# Patient Record
Sex: Female | Born: 1995 | Race: Black or African American | Hispanic: No | Marital: Single | Smoking: Never smoker
Health system: Southern US, Community
[De-identification: ages and names within clinical notes are randomized; demographics above are authoritative.]

## PROBLEM LIST (undated history)

## (undated) DIAGNOSIS — A749 Chlamydial infection, unspecified: Secondary | ICD-10-CM

## (undated) DIAGNOSIS — Z3041 Encounter for surveillance of contraceptive pills: Principal | ICD-10-CM

## (undated) DIAGNOSIS — D649 Anemia, unspecified: Secondary | ICD-10-CM

## (undated) DIAGNOSIS — J45909 Unspecified asthma, uncomplicated: Secondary | ICD-10-CM

---

## 2018-08-12 ENCOUNTER — Encounter (HOSPITAL_COMMUNITY): Payer: Self-pay | Admitting: Emergency Medicine

## 2018-08-12 ENCOUNTER — Ambulatory Visit (HOSPITAL_COMMUNITY)
Admission: EM | Admit: 2018-08-12 | Discharge: 2018-08-12 | Disposition: A | Discharge status: Home / Self Care | Payer: BLUE CROSS/BLUE SHIELD | Attending: Family Medicine | Admitting: Family Medicine

## 2018-08-12 DIAGNOSIS — Z91018 Allergy to other foods: Secondary | ICD-10-CM | POA: Diagnosis not present

## 2018-08-12 DIAGNOSIS — R05 Cough: Secondary | ICD-10-CM | POA: Insufficient documentation

## 2018-08-12 DIAGNOSIS — J358 Other chronic diseases of tonsils and adenoids: Secondary | ICD-10-CM | POA: Diagnosis not present

## 2018-08-12 LAB — POCT RAPID STREP A: Streptococcus, Group A Screen (Direct): NEGATIVE

## 2018-08-12 NOTE — ED Provider Notes (Signed)
[facility]    CSN: [id number] Arrival date & time: 08/12/18  1057     History   Chief Complaint No chief complaint on file.   HPI Joanne Dodson is a 22 y.o. female.   Joanne Dodson presents with complaints of white spots to her tonsils which have been present and irritating since approximately 10/5. States has had similar in the past. No fevers. No rash. No gi/gu complaints. States she has some congestion and cough. Has not taken any medications for her symptoms. No known ill contacts. No pain to throat. States feels slightly swollen any maybe more difficult to swallow. Has noticed her mouth is more dry. She otherwise feels very well. No fatigue, no ear pain. Without contributing medical history.      ROS per HPI.      History reviewed. No pertinent past medical history.  There are no active problems to display for this patient.   History reviewed. No pertinent surgical history.  OB History   None      Home Medications    Prior to Admission medications   Not on File    Family History Family History  Problem Relation Age of Onset  . Healthy Other     Social History Social History   Tobacco Use  . Smoking status: Never Smoker  Substance Use Topics  . Alcohol use: Never    Frequency: Never  . Drug use: Never     Allergies   Corn-containing products; Soy allergy; and Wheat bran   Review of Systems Review of Systems   Physical Exam Triage Vital Signs ED Triage Vitals  Enc Vitals Group     BP 08/12/18 1119 126/86     Pulse Rate 08/12/18 1119 89     Resp 08/12/18 1119 16     Temp 08/12/18 1119 98.5 F (36.9 C)     Temp src --      SpO2 08/12/18 1119 96 %     Weight --      Height --      Head Circumference --      Peak Flow --      Pain Score 08/12/18 1120 0     Pain Loc --      Pain Edu? --      Excl. in GC? --    No data found.  Updated Vital Signs BP 126/86   Pulse 89   Temp 98.5 F (36.9 C)   Resp 16   LMP  07/19/2018   SpO2 96%   Visual Acuity Right Eye Distance:   Left Eye Distance:   Bilateral Distance:    Right Eye Near:   Left Eye Near:    Bilateral Near:     Physical Exam  Constitutional: She is oriented to person, place, and time. She appears well-developed and well-nourished. No distress.  HENT:  Head: Normocephalic and atraumatic.  Right Ear: Tympanic membrane, external ear and ear canal normal.  Left Ear: Tympanic membrane, external ear and ear canal normal.  Nose: Nose normal.  Mouth/Throat: Uvula is midline, oropharynx is clear and moist and mucous membranes are normal. Tonsils are 2+ on the right. Tonsils are 2+ on the left. No tonsillar exudate.  Bilateral small tonsil stones noted   Eyes: Pupils are equal, round, and reactive to light. Conjunctivae and EOM are normal.  Cardiovascular: Normal rate, regular rhythm and normal heart sounds.  Pulmonary/Chest: Effort normal and breath sounds normal.  Neurological: She is alert and oriented to  person, place, and time.  Skin: Skin is warm and dry.     UC Treatments / Results  Labs (all labs ordered are listed, but only abnormal results are displayed) Labs Reviewed  CULTURE, GROUP A STREP [facility])  POCT RAPID STREP A    EKG None  Radiology No results found.  Procedures Procedures (including critical care time)  Medications Ordered in UC Medications - No data to display  Initial Impression / Assessment and Plan / UC Course  I have reviewed the triage vital signs and the nursing notes.  Pertinent labs & imaging results that were available during my care of the patient were reviewed by me and considered in my medical decision making (see chart for details).     Afebrile. Non toxic. Negative rapid strep. Exam consistent with tonsil stones. Supportive cares recommended. Push fluids. Follow up with PCP and/or ENT as needed. Patient verbalized understanding and agreeable to plan.   Final Clinical Impressions(s) /  UC Diagnoses   Final diagnoses:  Tonsil stone     Discharge Instructions     Negative rapid strep here today.  Appears more likely to be tonsil stones which you are feeling and seeing.  Gargling with warm salt water can be helpful, or even using a water pick can be helpful.  If symptoms worsen or do not improve in the next week to return to be seen or to follow up with your PCP.   May follow up with Ear Nose and Throat as needed for further evaluation.    ED Prescriptions    None     Controlled Substance Prescriptions  Controlled Substance Registry consulted? Not Applicable   Georgetta Haber, NP 08/12/18 1152

## 2018-08-12 NOTE — Discharge Instructions (Signed)
Negative rapid strep here today.  Appears more likely to be tonsil stones which you are feeling and seeing.  Gargling with warm salt water can be helpful, or even using a water pick can be helpful.  If symptoms worsen or do not improve in the next week to return to be seen or to follow up with your PCP.   May follow up with Ear Nose and Throat as needed for further evaluation.

## 2018-08-12 NOTE — ED Triage Notes (Signed)
Pt states her tonsils are enlarged with white spots on them denies pain.

## 2018-08-14 LAB — CULTURE, GROUP A STREP (THRC)

## 2019-06-18 ENCOUNTER — Emergency Department (HOSPITAL_COMMUNITY)
Admission: EM | Admit: 2019-06-18 | Discharge: 2019-06-18 | Disposition: A | Discharge status: Home / Self Care | Payer: BC Managed Care – PPO | Attending: Emergency Medicine | Admitting: Emergency Medicine

## 2019-06-18 ENCOUNTER — Ambulatory Visit (HOSPITAL_COMMUNITY)
Admission: EM | Admit: 2019-06-18 | Discharge: 2019-06-18 | Disposition: A | Discharge status: Home / Self Care | Payer: BC Managed Care – PPO | Source: Home / Self Care

## 2019-06-18 ENCOUNTER — Encounter (HOSPITAL_COMMUNITY): Payer: Self-pay

## 2019-06-18 ENCOUNTER — Emergency Department (HOSPITAL_COMMUNITY): Payer: BC Managed Care – PPO

## 2019-06-18 ENCOUNTER — Other Ambulatory Visit: Payer: Self-pay

## 2019-06-18 DIAGNOSIS — R55 Syncope and collapse: Secondary | ICD-10-CM | POA: Diagnosis not present

## 2019-06-18 DIAGNOSIS — J45909 Unspecified asthma, uncomplicated: Secondary | ICD-10-CM | POA: Diagnosis not present

## 2019-06-18 HISTORY — DX: Unspecified asthma, uncomplicated: J45.909

## 2019-06-18 HISTORY — DX: Anemia, unspecified: D64.9

## 2019-06-18 LAB — CBC
HCT: 39.8 % (ref 36.0–46.0)
Hemoglobin: 12.7 g/dL (ref 12.0–15.0)
MCH: 26.3 pg (ref 26.0–34.0)
MCHC: 31.9 g/dL (ref 30.0–36.0)
MCV: 82.4 fL (ref 80.0–100.0)
Platelets: 373 10*3/uL (ref 150–400)
RBC: 4.83 MIL/uL (ref 3.87–5.11)
RDW: 13.6 % (ref 11.5–15.5)
WBC: 9 10*3/uL (ref 4.0–10.5)
nRBC: 0 % (ref 0.0–0.2)

## 2019-06-18 LAB — I-STAT BETA HCG BLOOD, ED (MC, WL, AP ONLY): I-stat hCG, quantitative: <5 m[IU]/mL (ref <5)

## 2019-06-18 LAB — URINALYSIS, ROUTINE W REFLEX MICROSCOPIC
Bilirubin Urine: NEGATIVE
Glucose, UA: NEGATIVE mg/dL
Ketones, ur: NEGATIVE mg/dL
Nitrite: NEGATIVE
Protein, ur: NEGATIVE mg/dL
Specific Gravity, Urine: 1.002 — ABNORMAL LOW (ref 1.005–1.030)
pH: 7 (ref 5.0–8.0)

## 2019-06-18 LAB — BASIC METABOLIC PANEL
Anion gap: 14 (ref 5–15)
BUN: 5 mg/dL — ABNORMAL LOW (ref 6–20)
CO2: 20 mmol/L — ABNORMAL LOW (ref 22–32)
Calcium: 9 mg/dL (ref 8.9–10.3)
Chloride: 101 mmol/L (ref 98–111)
Creatinine, Ser: 0.95 mg/dL (ref 0.44–1.00)
GFR calc Af Amer: >60 mL/min (ref >60)
GFR calc non Af Amer: >60 mL/min (ref >60)
Glucose, Bld: 88 mg/dL (ref 70–99)
Potassium: 3.5 mmol/L (ref 3.5–5.1)
Sodium: 135 mmol/L (ref 135–145)

## 2019-06-18 MED ORDER — SODIUM CHLORIDE 0.9% FLUSH: 3.0000 mL | Freq: Once | INTRAVENOUS | Status: DC

## 2019-06-18 MED ORDER — SODIUM CHLORIDE 0.9 % IV BOLUS
1000.0000 mL | Freq: Once | INTRAVENOUS | Status: AC
  Administered 2019-06-18: 1000 mL via INTRAVENOUS

## 2019-06-18 NOTE — ED Provider Notes (Signed)
[facility] Provider Note   CSN: [id number] Arrival date & time: 06/18/19  1624     History   Chief Complaint Chief Complaint  Patient presents with   Loss of Consciousness    HPI Joanne Dodson is a 23 y.o. female with no significant past medical history presenting to the ED after syncopal episode at work earlier today.  Patient reports she was feeling lightheaded while working at [employer] and turned around to tell someone she was not feeling well when she collapsed and hit her head on the way to the ground.  She denies having any chest pain, shortness of breath, or palpitations prior to the syncopal episode.  She reports that yesterday she felt more fatigued than normal but was feeling better today prior to this episode.  She reports she ate an adequate breakfast and drank 3 bottles of water earlier today.  She denies any history of prior syncopal episodes.  Her only complaint is a mild headache around her left eye where she struck her head.  She denies any recent illness, fever, chills, nausea, vomiting, diarrhea, or any other complaints.     The history is provided by the patient.    Past Medical History:  Diagnosis Date   Anemia    Asthma     There are no active problems to display for this patient.   History reviewed. No pertinent surgical history.   OB History   No obstetric history on file.      Home Medications    Prior to Admission medications   Not on File    Family History Family History  Problem Relation Age of Onset   Healthy Other     Social History Social History   Tobacco Use   Smoking status: Never Smoker  Substance Use Topics   Alcohol use: Never    Frequency: Never   Drug use: Never     Allergies   Corn-containing products, Soy allergy, and Wheat bran   Review of Systems Review of Systems  Constitutional: Negative for chills and fever.  HENT: Negative for ear pain and  sore throat.   Eyes: Negative for pain and visual disturbance.  Respiratory: Negative for cough and shortness of breath.   Cardiovascular: Negative for chest pain and palpitations.  Gastrointestinal: Negative for abdominal pain and vomiting.  Genitourinary: Negative for dysuria and hematuria.  Musculoskeletal: Negative for arthralgias and back pain.  Skin: Negative for color change and rash.  Neurological: Positive for syncope and light-headedness. Negative for seizures.  All other systems reviewed and are negative.    Physical Exam Updated Vital Signs BP 114/73    Pulse 90    Temp 99.6 F (37.6 C) (Oral)    Resp 16    Ht 5\' 2"  (1.575 m)    Wt 66.2 kg    LMP 06/14/2019    SpO2 99%    BMI 26.70 kg/m   Physical Exam Vitals signs and nursing note reviewed.  Constitutional:      General: She is not in acute distress.    Appearance: Normal appearance. She is normal weight. She is not ill-appearing, toxic-appearing or diaphoretic.  HENT:     Head: Normocephalic and atraumatic.     Nose: Nose normal. No congestion or rhinorrhea.     Mouth/Throat:     Mouth: Mucous membranes are moist.     Pharynx: Oropharynx is clear. No oropharyngeal exudate or posterior oropharyngeal erythema.  Eyes:  Extraocular Movements: Extraocular movements intact.     Conjunctiva/sclera: Conjunctivae normal.     Pupils: Pupils are equal, round, and reactive to light.  Neck:     Musculoskeletal: Normal range of motion and neck supple. No neck rigidity or muscular tenderness.  Cardiovascular:     Rate and Rhythm: Normal rate and regular rhythm.     Pulses: Normal pulses.     Heart sounds: Normal heart sounds. No murmur. No friction rub. No gallop.   Pulmonary:     Effort: Pulmonary effort is normal. No respiratory distress.     Breath sounds: Normal breath sounds. No stridor. No wheezing, rhonchi or rales.  Chest:     Chest wall: No tenderness.  Abdominal:     General: Abdomen is flat. There is no  distension.     Palpations: Abdomen is soft.     Tenderness: There is no abdominal tenderness. There is no guarding or rebound.  Musculoskeletal: Normal range of motion.        General: No swelling, tenderness, deformity or signs of injury.  Skin:    General: Skin is warm and dry.  Neurological:     General: No focal deficit present.     Mental Status: She is alert and oriented to person, place, and time. Mental status is at baseline.     Cranial Nerves: No cranial nerve deficit.     Sensory: No sensory deficit.     Motor: No weakness.      ED Treatments / Results  Labs (all labs ordered are listed, but only abnormal results are displayed) Labs Reviewed  BASIC METABOLIC PANEL - Abnormal; Notable for the following components:      Result Value   CO2 20 (*)    BUN 5 (*)    All other components within normal limits  URINALYSIS, ROUTINE W REFLEX MICROSCOPIC - Abnormal; Notable for the following components:   Color, Urine STRAW (*)    Specific Gravity, Urine 1.002 (*)    Hgb urine dipstick MODERATE (*)    Leukocytes,Ua LARGE (*)    Bacteria, UA RARE (*)    All other components within normal limits  CBC  I-STAT BETA HCG BLOOD, ED ([facility], [facility], [facility] ONLY)    EKG EKG Interpretation  Date/Time:  Saturday June 18 2019 16:39:06 EDT Ventricular Rate:  119 PR Interval:  118 QRS Duration: 82 QT Interval:  320 QTC Calculation: 450 R Axis:   84 Text Interpretation:  Sinus tachycardia Biatrial enlargement Abnormal ECG No old tracing to compare Confirmed by Eber HongMiller, Brian ([id number]) on 06/18/2019 5:25:49 PM   Radiology Ct Head Wo Contrast  Result Date: 06/18/2019 CLINICAL DATA:  Syncope, fall, headache EXAM: CT HEAD WITHOUT CONTRAST TECHNIQUE: Contiguous axial images were obtained from the base of the skull through the vertex without intravenous contrast. COMPARISON:  None. FINDINGS: Brain: No evidence of acute infarction, hemorrhage, hydrocephalus, extra-axial collection or mass lesion/mass  effect. Vascular: No hyperdense vessel or unexpected calcification. Skull: Normal. Negative for fracture or focal lesion. Sinuses/Orbits: The visualized paranasal sinuses are essentially clear. The mastoid air cells are unopacified. Other: None. IMPRESSION: Normal head CT. Electronically Signed   By: Charline BillsSriyesh  Krishnan M.D.   On: 06/18/2019 17:05    Procedures Procedures (including critical care time)  Medications Ordered in ED Medications  sodium chloride 0.9 % bolus 1,000 mL (0 mLs Intravenous Stopped 06/18/19 1951)     Initial Impression / Assessment and Plan / ED Course  I have reviewed the triage  vital signs and the nursing notes.  Pertinent labs & imaging results that were available during my care of the patient were reviewed by me and considered in my medical decision making (see chart for details).        Joanne Dodson is a 23 y.o. female with no significant past medical history presenting to the ED after syncopal episode at work earlier today.  On exam, patient appears well and has normal vital signs.  EKG was reviewed and showed sinus tachycardia with a rate of 119.  There is no evidence of delta wave or arrhythmia.  Patient's heart rate improved at time of examination.  Labs including BMP, CBC, urinalysis, and hCG are all unremarkable.  There is no evidence of anemia or dehydration.  Orthostatic vitals were obtained which showed no change in blood pressure but some tachycardia on standing.  Patient was given a 1 L bolus of normal saline.  Patient syncope is most likely vasovagal in nature.  She was advised to follow-up with her primary care physician and to return to the ED for any recurrence of symptoms.  She was discharged home in stable condition.   Final Clinical Impressions(s) / ED Diagnoses   Final diagnoses:  Syncope and collapse    ED Discharge Orders    None       Garry HeaterEames, Jenay Morici, MD 06/19/19 Reola [id number]    Eber HongMiller, Brian, MD 06/19/19 1459

## 2019-06-18 NOTE — ED Provider Notes (Signed)
I saw and evaluated the patient, reviewed the resident's note and I agree with the findings and plan.  Pertinent History: The patient is an otherwise healthy 23 year old female who takes no significant over-the-counter medications, prescription medications and does not smoke drink or do any drugs.  She denies any caffeine intake.  She was at work today when she had a syncopal episode.  She does remember feeling lightheaded just prior to this but had no palpitations chest pain or shortness of breath.  Yesterday she was excessively fatigued, she feels less so today and has not had any fevers nausea vomiting or diarrhea.  She did have a good amount of food for breakfast and had 3 water bottles to drink throughout the day.  At this time she feels back to her normal self other than feeling tired.  Pertinent Exam findings: On exam the patient has a soft nontender abdomen with clear heart and lung sounds, she does get tachycardic with standing but I visualized the patient ambulate throughout the department and she appears very well with normal gait, normal memory, normal neurologic exam.  I was personally present and directly supervised the following procedures:  I have looked at the EKG, no acute findings, the patient does have some slight orthostasis, IV fluids given, encouraged her to remain at home resting for the next 24 hours.  Patient agreeable, stable for discharge, labs reviewed and unremarkable.  Results for orders placed or performed during the hospital encounter of 29/52/84  Basic metabolic panel  Result Value Ref Range   Sodium 135 135 - 145 mmol/L   Potassium 3.5 3.5 - 5.1 mmol/L   Chloride 101 98 - 111 mmol/L   CO2 20 (L) 22 - 32 mmol/L   Glucose, Bld 88 70 - 99 mg/dL   BUN 5 (L) 6 - 20 mg/dL   Creatinine, Ser 0.95 0.44 - 1.00 mg/dL   Calcium 9.0 8.9 - 10.3 mg/dL   GFR calc non Af Amer >60 >60 mL/min   GFR calc Af Amer >60 >60 mL/min   Anion gap 14 5 - 15  CBC  Result Value Ref Range    WBC 9.0 4.0 - 10.5 K/uL   RBC 4.83 3.87 - 5.11 MIL/uL   Hemoglobin 12.7 12.0 - 15.0 g/dL   HCT 39.8 36.0 - 46.0 %   MCV 82.4 80.0 - 100.0 fL   MCH 26.3 26.0 - 34.0 pg   MCHC 31.9 30.0 - 36.0 g/dL   RDW 13.6 11.5 - 15.5 %   Platelets 373 150 - 400 K/uL   nRBC 0.0 0.0 - 0.2 %  Urinalysis, Routine w reflex microscopic  Result Value Ref Range   Color, Urine STRAW (A) YELLOW   APPearance CLEAR CLEAR   Specific Gravity, Urine 1.002 (L) 1.005 - 1.030   pH 7.0 5.0 - 8.0   Glucose, UA NEGATIVE NEGATIVE mg/dL   Hgb urine dipstick MODERATE (A) NEGATIVE   Bilirubin Urine NEGATIVE NEGATIVE   Ketones, ur NEGATIVE NEGATIVE mg/dL   Protein, ur NEGATIVE NEGATIVE mg/dL   Nitrite NEGATIVE NEGATIVE   Leukocytes,Ua LARGE (A) NEGATIVE   WBC, UA 6-10 0 - 5 WBC/hpf   Bacteria, UA RARE (A) NONE SEEN   Squamous Epithelial / LPF 0-5 0 - 5  I-Stat beta hCG blood, ED  Result Value Ref Range   I-stat hCG, quantitative <5.0 <5 mIU/mL   Comment 3           Ct Head Wo Contrast  Result Date: 06/18/2019  CLINICAL DATA:  Syncope, fall, headache EXAM: CT HEAD WITHOUT CONTRAST TECHNIQUE: Contiguous axial images were obtained from the base of the skull through the vertex without intravenous contrast. COMPARISON:  None. FINDINGS: Brain: No evidence of acute infarction, hemorrhage, hydrocephalus, extra-axial collection or mass lesion/mass effect. Vascular: No hyperdense vessel or unexpected calcification. Skull: Normal. Negative for fracture or focal lesion. Sinuses/Orbits: The visualized paranasal sinuses are essentially clear. The mastoid air cells are unopacified. Other: None. IMPRESSION: Normal head CT. Electronically Signed   By: Charline BillsSriyesh  Krishnan M.D.   On: 06/18/2019 17:05    EKG Interpretation  Date/Time:  Saturday June 18 2019 16:39:06 EDT Ventricular Rate:  119 PR Interval:  118 QRS Duration: 82 QT Interval:  320 QTC Calculation: 450 R Axis:   84 Text Interpretation:  Sinus tachycardia Biatrial  enlargement Abnormal ECG No old tracing to compare Confirmed by Eber HongMiller, Taleen Prosser ([id number]) on 06/18/2019 5:25:49 PM        I personally interpreted the EKG as well as the resident and agree with the interpretation on the resident's chart.  Final diagnoses:  Syncope and collapse      Eber HongMiller, Jahaira Earnhart, MD 06/19/19 1459

## 2019-06-18 NOTE — ED Triage Notes (Signed)
Pt reports syncopal episode while at work today, hit her head during the fall, no other injuries. Pt c.o feeling weak and having a headache at this time. Pt a.o, ambulatory

## 2019-06-18 NOTE — ED Notes (Signed)
Patient Alert and oriented to baseline. Stable and ambulatory to baseline. Patient verbalized understanding of the discharge instructions.  Patient belongings were taken by the patient.   

## 2019-10-10 ENCOUNTER — Other Ambulatory Visit: Payer: Self-pay

## 2019-10-10 DIAGNOSIS — Z20822 Contact with and (suspected) exposure to covid-19: Secondary | ICD-10-CM

## 2019-10-12 LAB — NOVEL CORONAVIRUS, NAA: SARS-CoV-2, NAA: NOT DETECTED

## 2020-04-02 IMAGING — CT CT HEAD WITHOUT CONTRAST
4 series · 16 of 47 positions shown, 18 images · non-contrast
Comparison: None.

CLINICAL DATA: Syncope, fall, headache

EXAM:
CT HEAD WITHOUT CONTRAST
TECHNIQUE: Contiguous axial images were obtained from the base of the skull
through the vertex without intravenous contrast.

[Series 3: head without · axial · non-contrast · 0.46mm/px · z∈[-152,-32]mm · 7 of 34 slices shown, 9 images]
[im 5/34  brain]
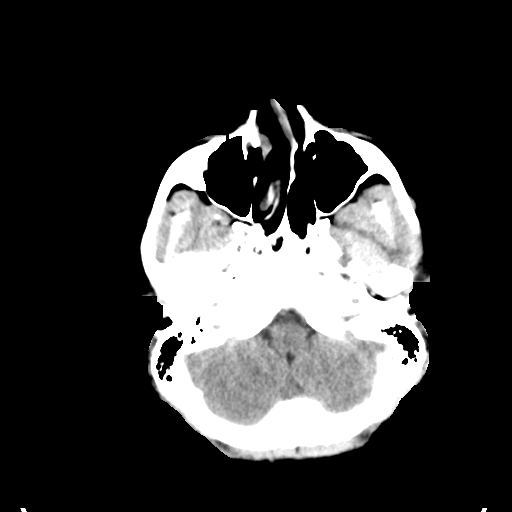
[im 5/34  bone]
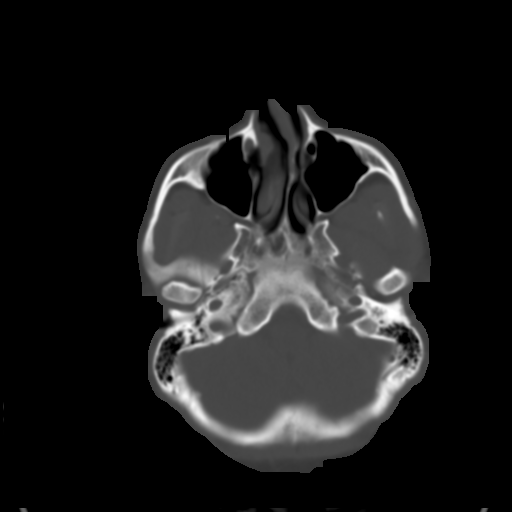
[im 9/34  brain]
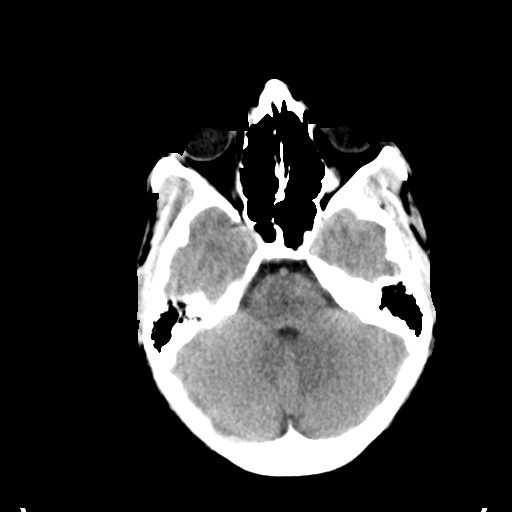
[im 13/34  brain]
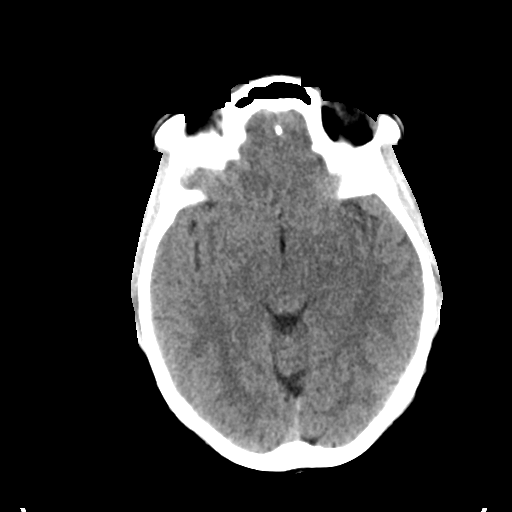
[im 17/34  brain]
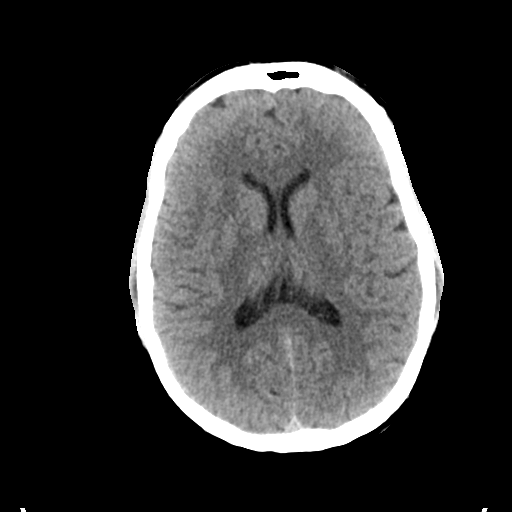
[im 21/34  brain]
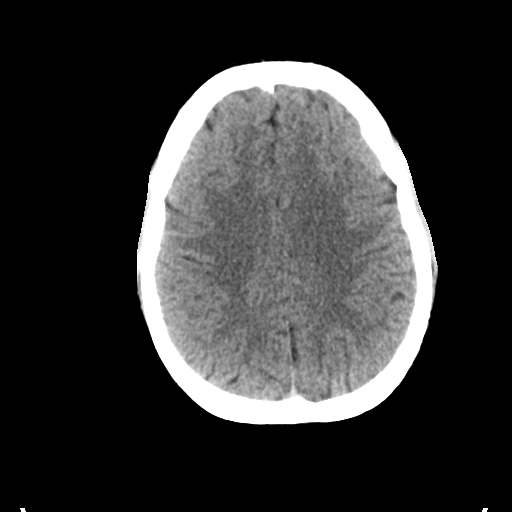
[im 21/34  bone]
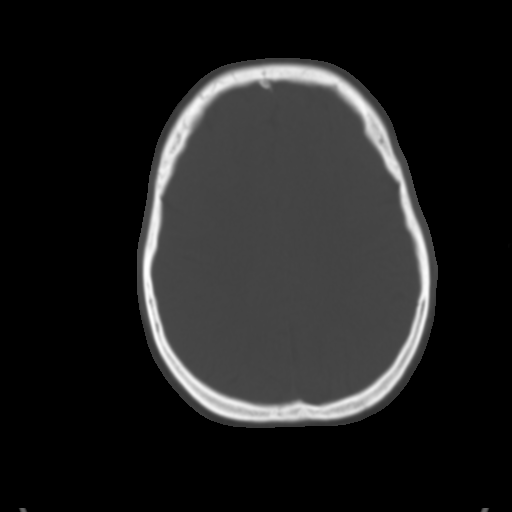
[im 25/34  brain]
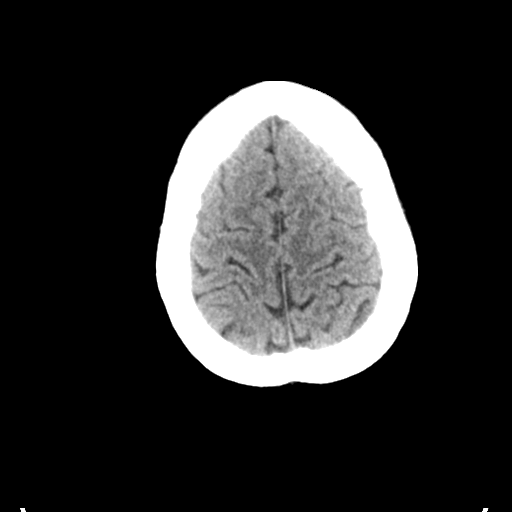
[im 29/34  brain]
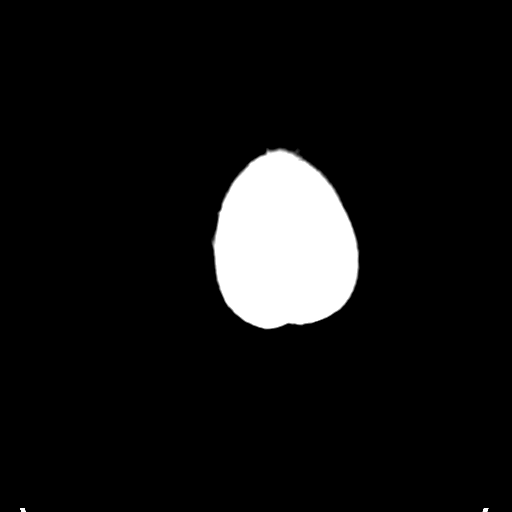

[Series 4: head bone · axial · 0.46mm/px · z∈[-156,-124]mm · 3 of 84 slices shown]
[im 9/84  bone]
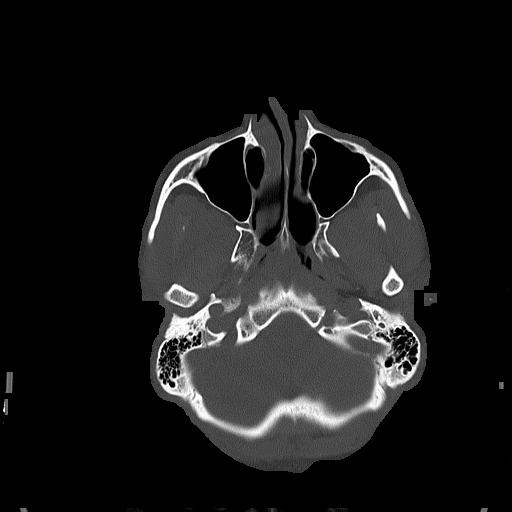
[im 17/84  bone]
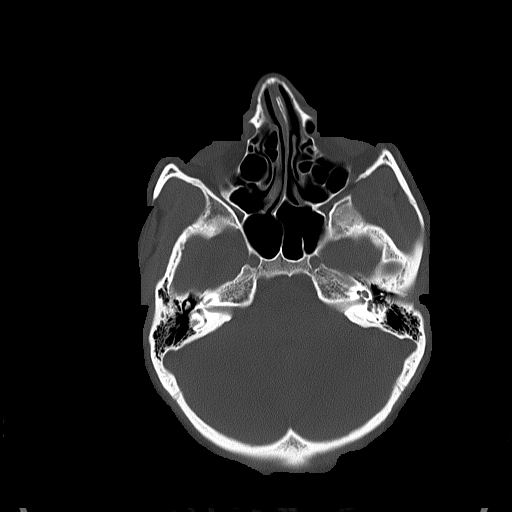
[im 25/84  bone]
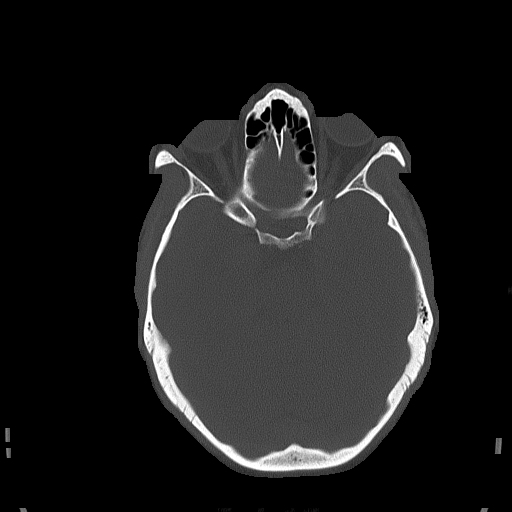

[Series 5: head without cor · coronal · non-contrast · 0.31mm/px · 3 of 71 slices shown]
[im 27/71  brain]
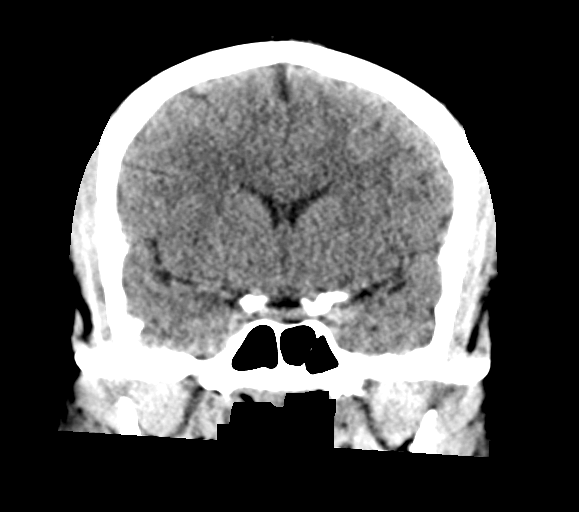
[im 33/71  brain]
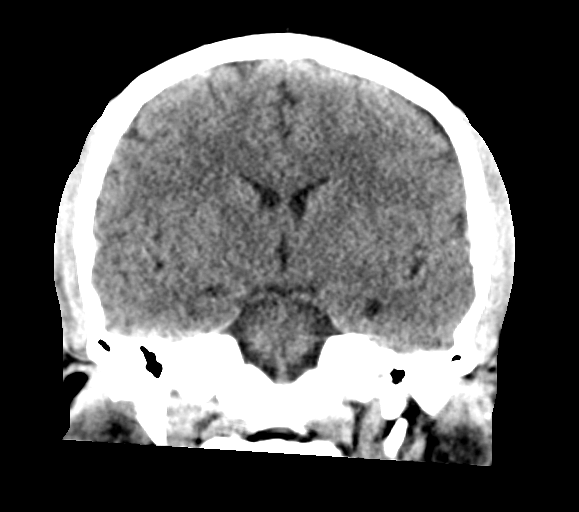
[im 38/71  brain]
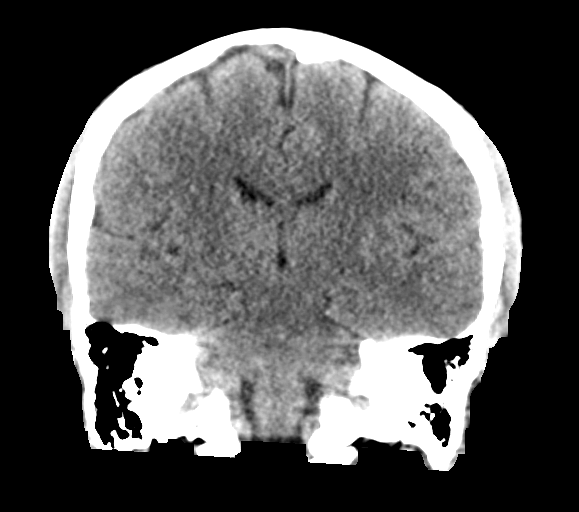

[Series 6: head without sag · sagittal · non-contrast · 0.33mm/px · 3 of 62 slices shown]
[im 21/62  brain]
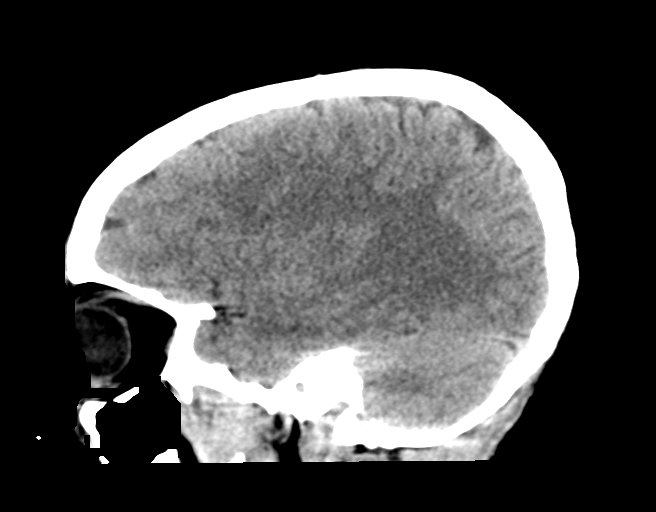
[im 31/62  brain]
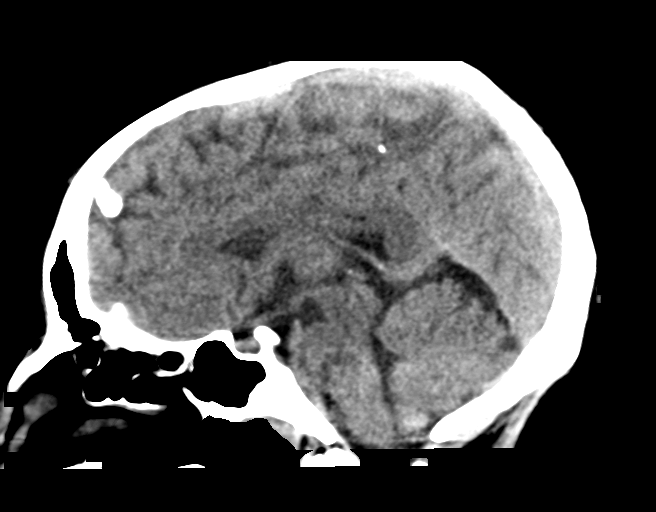
[im 41/62  brain]
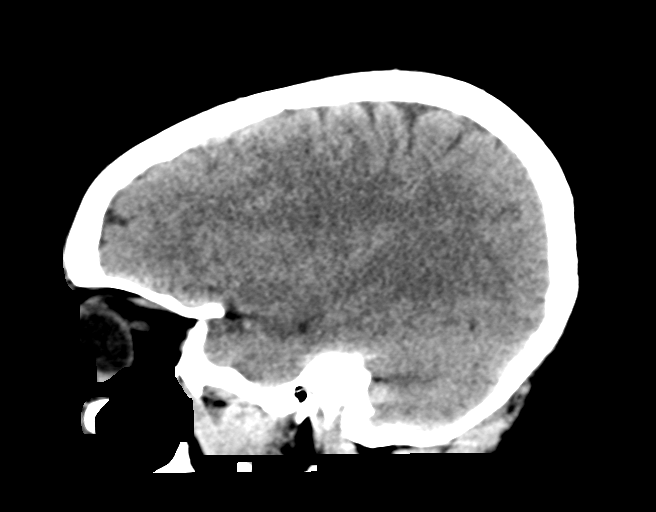

[16 of 47 positions shown; findings below may reference images not displayed]

FINDINGS: Brain: No evidence of acute infarction, hemorrhage, hydrocephalus,
extra-axial collection or mass lesion/mass effect.

Vascular: No hyperdense vessel or unexpected calcification.

Skull: Normal. Negative for fracture or focal lesion.

Sinuses/Orbits: The visualized paranasal sinuses are essentially
clear. The mastoid air cells are unopacified.

Other: None.
IMPRESSION: Normal head CT.

## 2021-10-01 ENCOUNTER — Ambulatory Visit
Admit: 2021-10-01 | Discharge: 2021-10-01 | Payer: PRIVATE HEALTH INSURANCE | Attending: Family Medicine | Primary: Family Medicine

## 2021-10-01 DIAGNOSIS — Z0000 Encounter for general adult medical examination without abnormal findings: Secondary | ICD-10-CM

## 2021-10-01 MED ORDER — NORETHIN ACE-ETH ESTRAD-FE 1-20 MG-MCG(24) PO CHEW
1-20-24 MG-MCG(24) | PACK | Freq: Every day | ORAL | 3 refills | Status: AC
Start: 2021-10-01 — End: 2021-12-24

## 2021-10-01 MED ORDER — TETANUS-DIPHTH-ACELL PERTUSSIS 5-2.5-18.5 LF-MCG/0.5 IM SUSP
5-2.5-18.5-0.5 LF-MCG/0.5 | Freq: Once | INTRAMUSCULAR | 0 refills | Status: DC
Start: 2021-10-01 — End: 2021-10-01

## 2021-10-01 MED ORDER — TETANUS-DIPHTH-ACELL PERTUSSIS 5-2.5-18.5 LF-MCG/0.5 IM SUSP
Freq: Once | INTRAMUSCULAR | 0 refills | Status: AC
Start: 2021-10-01 — End: 2021-10-01

## 2021-10-01 NOTE — Progress Notes (Signed)
SUBJECTIVE:   25 y.o. female for annual routine Pap and checkup.  25 year old G0 P0 female comes in to establish. LMP Nov 26 x 5 days. Menses monthly lasting 5 days. No problems with birth control. Last pap smear 02/21/21 normal. Always had normal pap smears. Has had 2 normal pap smears.  Mgm gm  Pgm lung cancer.  BMI 31. Says her diet is not good although exercises regularly with pilates. Will try to lose 5-10 lbs by next visit.  Last blood work 10/22. Normal cholesterol and sugars.   Current Outpatient Medications   Medication Sig Dispense Refill    Tetanus-Diphth-Acell Pertussis (BOOSTRIX) 5-2.5-18.5 LF-MCG/0.5 injection Inject 0.5 mLs into the muscle once for 1 dose 1 each 0    Norethin Ace-Eth Estrad-FE (FINZALA) 1-20 MG-MCG(24) CHEW Take 1 tablet by mouth daily 3 packet 3     No current facility-administered medications for this visit.     Allergies: Patient has no known allergies.   Patient's last menstrual period was 09/19/2021.    ROS:  Feeling well. No dyspnea or chest pain on exertion.  No abdominal pain, change in bowel habits, black or bloody stools.  No urinary tract symptoms. GYN ROS: normal menses, no abnormal bleeding, pelvic pain or discharge, no breast pain or new or enlarging lumps on self exam. No neurological complaints.    OBJECTIVE:   The patient appears well, alert, oriented x 3, in no distress.  BP 121/88    Pulse 85    Temp 98.8 ??F (37.1 ??C) (Temporal)    Resp 14    Ht 5' 2.5" (1.588 m)    Wt 173 lb (78.5 kg)    LMP 09/19/2021    SpO2 99%    BMI 31.14 kg/m??   ENT normal.  Neck supple. No adenopathy or thyromegaly. PERLA. Lungs are clear, good air entry, no wheezes, rhonchi or rales. S1 and S2 normal, no murmurs, regular rate and rhythm. Abdomen soft without tenderness, guarding, mass or organomegaly. Extremities show no edema, normal peripheral pulses. Neurological is normal, no focal findings.    BREAST EXAM: performed by specialist    PELVIC EXAM: exam declined by the  patient    ASSESSMENT:   well woman    PLAN:   pap smear will do next year  return annually or prn      ICD-10-CM    1. Annual physical exam  Z00.00       2. Need for Tdap vaccination  Z23 Tetanus-Diphth-Acell Pertussis (BOOSTRIX) 5-2.5-18.5 LF-MCG/0.5 injection     DISCONTINUED: Tetanus-Diphth-Acell Pertussis (BOOSTRIX) 5-2.5-18.5 LF-MCG/0.5 injection      3. BMI 31.0-31.9,adult  Z68.31       4. Screening for diabetes mellitus  Z13.1 Comprehensive Metabolic Panel     Hemoglobin F6C     Urinalysis with Reflex to Culture      5. Screening for thyroid disorder  Z13.29 TSH with Reflex      6. Screening, ischemic heart disease  Z13.6 CBC with Auto Differential     Comprehensive Metabolic Panel     Lipid Panel      7. Need for hepatitis C screening test  Z11.59 Hepatitis C Antibody      8. Encounter for surveillance of contraceptive pills  Z30.41 Norethin Ace-Eth Estrad-FE (FINZALA) 1-20 MG-MCG(24) CHEW      1.  Annual physical.  Patient refused flu shot.  Tdap vaccination sent to pharmacy.  2.  BMI of 31.  Advised to improve diet.  Continue exercise.  She will try to lose 10 pounds by next visit.  3.  Birth control.  Doing well.  Continue oral contraceptive pills.  4.  Previous doctor will send immunization records.  Follow-up 1 year labs prior

## 2022-02-03 ENCOUNTER — Ambulatory Visit
Admit: 2022-02-03 | Discharge: 2022-02-03 | Payer: PRIVATE HEALTH INSURANCE | Attending: Women's Health | Primary: Family Medicine

## 2022-02-03 DIAGNOSIS — Z124 Encounter for screening for malignant neoplasm of cervix: Secondary | ICD-10-CM

## 2022-02-03 MED ORDER — NORETHIN ACE-ETH ESTRAD-FE 1-20 MG-MCG(24) PO CHEW
1-20 MG-MCG(24) | PACK | Freq: Every day | ORAL | 3 refills | Status: AC
Start: 2022-02-03 — End: 2022-05-07

## 2022-02-03 NOTE — Progress Notes (Signed)
Pt comes in today for AE.     LAST PAP:  Pt reports 2021 negative     LAST MAMMO:  Never    LMP:  Patient's last menstrual period was 01/15/2022 (exact date).    BIRTH CONTROL:  OCP (estrogen/progesterone)    TOBACCO USE:  No    FAMILY HISTORY OF:   Breast Cancer:  No   Ovarian Cancer:  No   Uterine Cancer:  No   Colon Cancer:  No    Vitals:    02/03/22 1352   BP: 124/80   Site: Left Upper Arm   Position: Sitting   Weight: 175 lb (79.4 kg)   Height: 5' 2.5" (1.588 m)        [place], [place]  02/03/22  2:00 PM

## 2022-02-03 NOTE — Assessment & Plan Note (Signed)
Pap today + GC  Declines serum std labs  mammo n/a  Refill sent for OCPs  Patient denies h/o DVT, stroke, MI, migraines, liver disease or HTN  RTO 1 year

## 2022-02-03 NOTE — Progress Notes (Signed)
Brianna Williamson is a 26 y.o. G0P0000 who is here today for annual exam. No concerns        Patient's last menstrual period was 01/15/2022 (exact date).    Menses:Q month, lasting 4-5 days    Menstrual Complaints: none    Birth Control:OCPs    Last Pap:2021    Hx abnormal pap or WLN:LGXQJJ    Hx of receiving HPV vaccination:received per pt    Last Mammo: n/a    Fam Hx of Breast, ovarian or uterine cancer:denies    Sexually Active:yes    Number of partners in the last year:1          ROS:    Breast: Denies pain, lump or nipple discharge    GYN: Denies pelvic pain, discharge, itching, odor or dysuria.     Constitutional: Negative for chills and fever.     HENT: Negative for severe headaches or vision changes    Respiratory: Negative for cough and shortness of breath.      Cardiovascular: Negative for chest pain and palpitations.     Gastrointestinal: Negative for nausea and vomiting. Negative for diarrhea and constipation    Genitourinary: Negative for dysuria and hematuria.     Musculoskeletal: Negative for back pain    Skin: Negative for rash and wound.     Psych: No depression or anxiety          History reviewed. No pertinent past medical history.    Past Surgical History:   Procedure Laterality Date    WISDOM TOOTH EXTRACTION         Family History   Problem Relation Age of Onset    Hypertension Maternal Grandmother     High Cholesterol Maternal Grandmother     No Known Problems Father     No Known Problems Mother     No Known Problems Brother     Breast Cancer Neg Hx     Ovarian Cancer Neg Hx     Uterine Cancer Neg Hx     Colon Cancer Neg Hx        Social History     Socioeconomic History    Marital status: Unknown     Spouse name: Not on file    Number of children: Not on file    Years of education: Not on file    Highest education level: Not on file   Occupational History    Not on file   Tobacco Use    Smoking status: Never    Smokeless tobacco: Never   Vaping Use    Vaping Use: Never used   Substance and  Sexual Activity    Alcohol use: Yes     Comment: socially    Drug use: Never    Sexual activity: Yes     Partners: Male   Other Topics Concern    Not on file   Social History Narrative    Abuse: Feels safe at home, no history of physical abuse, no history of sexual abuse      Social Determinants of Psychologist, prison and probation services Strain: Low Risk     Difficulty of Paying Living Expenses: Not hard at all   Food Insecurity: No Food Insecurity    Worried About Programme researcher, broadcasting/film/video in the Last Year: Never true    Ran Out of Food in the Last Year: Never true   Transportation Needs: Not on file   Physical Activity: Not on file  Stress: Not on file   Social Connections: Not on file   Intimate Partner Violence: Not on file   Housing Stability: Not on file           Objective:    Vitals:    02/03/22 1352   BP: 124/80   Site: Left Upper Arm   Position: Sitting   Weight: 175 lb (79.4 kg)   Height: 5' 2.5" (1.588 m)           Constitutional:  well-developed, well-nourished, and in no distress.     Mental: Alert and awake. Oriented to person/place/time. Able to follow commands    Eyes: EOM nl, Sclera nl, Ocular Discharge not visualized    HENT: Normocephalic and atraumatic. Mouth/Throat: Mucous membranes are moist. External Ears: nl. No cervical code adneopathy    Neck: Normal range of motion. Neck supple. No thyromegaly present.     Cardiovascular: Normal rate and regular rhythm.      Pulmonary: Effort normal; No visualized signs of difficulty breathing or respiratory distress; breath sounds normal.    Breast: The breasts exhibit no masses, no skin changes and no tenderness. No axillary node adenopathy. Bilateral piercings    Abdominal: Soft. There is no tenderness. There is no rebound.     Pelvic Exam:       External: normal female genitalia without lesions or masses       Vagina: normal without lesions or masses, no discharge noted      Cervix: normal without lesions or masses       Adnexa: normal bimanual exam without masses  or fullness       Uterus: uterus is normal size, shape, consistency and nontender    Musculoskeletal: Normal range of motion. Normal gait with no signs of ataxia     Neurological: No Facial Asymmetry (Cranial nerve 7 motor function); No gaze palsy; normal coordination, muscle strength and tone     Skin: Skin is warm and dry. No significant exanthematous lesions or discoloration noted on facial skin      Psych: Normal affect. No hallucinations            Assessment/Plan            Patient Active Problem List    Diagnosis Date Noted    BMI 31.0-31.9,adult 10/01/2021     Priority: Medium    Women's annual routine gynecological examination 02/03/2022     Assessment & Plan Note:     Pap today + GC  Declines serum std labs  mammo n/a  Refill sent for OCPs  Patient denies h/o DVT, stroke, MI, migraines, liver disease or HTN  RTO 1 year             Problem List Items Addressed This Visit          Other    Women's annual routine gynecological examination     Pap today + GC  Declines serum std labs  mammo n/a  Refill sent for OCPs  Patient denies h/o DVT, stroke, MI, migraines, liver disease or HTN  RTO 1 year              Other Visit Diagnoses       Pap smear for cervical cancer screening    -  Primary    Relevant Orders    PAP IG, CT-NG, rfx Aptima HPV ASCUS ([id number])    Screen for STD (sexually transmitted disease)        Relevant Orders  PAP IG, CT-NG, rfx Aptima HPV ASCUS ([id number])            Orders Placed This Encounter   Procedures    PAP IG, CT-NG, rfx Aptima HPV ASCUS ([id number])       Outpatient Encounter Medications as of 02/03/2022   Medication Sig Dispense Refill    Norethin Ace-Eth Estrad-FE (FINZALA) 1-20 MG-MCG(24) CHEW Take 1 tablet by mouth daily 3 packet 3     No facility-administered encounter medications on file as of 02/03/2022.

## 2022-02-03 NOTE — Progress Notes (Signed)
I have reviewed the patient's visit today including history, exam and assessment by Memory Argue, WHNP-BC.  I agree with treatment/plan as above.    Phylliss Bob, MD  2:54 PM  02/03/22

## 2022-02-10 ENCOUNTER — Encounter

## 2022-02-10 LAB — PAP IG, CT-NG, RFX APTIMA HPV ASCUS (199320)
Chlamydia trachomatis, NAA: POSITIVE — AB
Neisseria Gonorrhoeae, NAA: NEGATIVE

## 2022-02-10 NOTE — Telephone Encounter (Signed)
Patient tested positive for Chlamydia. This is a sexually transmitted infection. This can be treated with the following, if not allergic    Azithromycin 1gram PO Once, No Refills    Patient needs to notify partner so they can follow up with their PCP or [facility] for testing and/or treatment. If patient is pregnant, we will retest during her pregnancy. If not pregnant, please schedule patient for retest in 3 months.        2. Patient positive for yeast infection. If having symptoms, can have one of the following for treatment, if not allergic    Diflucan 150 mg PO Take 1 tab and repeat in 3 days if needed, #2, No Refills (NOT TO BE PRESCRIBED IF PREGNANT)    OR    Terazol 7 Apply 1 applicator nightly for 7 nights, #7, No Refills

## 2022-02-10 NOTE — Telephone Encounter (Signed)
Pt lvm asking about test results. Called pt back, message below reviewed with pt, pt voiced understanding.     RX pend

## 2022-02-11 MED ORDER — FLUCONAZOLE 150 MG PO TABS
150 MG | ORAL_TABLET | ORAL | 0 refills | Status: AC
Start: 2022-02-11 — End: 2022-05-07

## 2022-02-11 MED ORDER — AZITHROMYCIN 500 MG PO TABS
500 MG | ORAL_TABLET | Freq: Once | ORAL | 0 refills | Status: AC
Start: 2022-02-11 — End: 2022-02-11

## 2022-02-11 NOTE — Telephone Encounter (Signed)
RX sent.

## 2022-02-13 NOTE — Telephone Encounter (Signed)
Called pt to schedule 3 month TOC, no answer, LVM.    MyChart message sent.

## 2022-05-07 ENCOUNTER — Ambulatory Visit
Admit: 2022-05-07 | Discharge: 2022-05-07 | Payer: PRIVATE HEALTH INSURANCE | Attending: Women's Health | Primary: Family Medicine

## 2022-05-07 DIAGNOSIS — Z113 Encounter for screening for infections with a predominantly sexual mode of transmission: Secondary | ICD-10-CM

## 2022-05-07 NOTE — Progress Notes (Signed)
Brianna Williamson is a 26 y.o. G0P0000 who is here today for TOC. Tested positive for chlamydia April 2023      Patient's last menstrual period was 04/10/2022 (exact date).        Past Medical History:   Diagnosis Date    Chlamydia        Past Surgical History:   Procedure Laterality Date    WISDOM TOOTH EXTRACTION         Family History   Problem Relation Age of Onset    Hypertension Maternal Grandmother     High Cholesterol Maternal Grandmother     No Known Problems Father     No Known Problems Mother     No Known Problems Brother     Breast Cancer Neg Hx     Ovarian Cancer Neg Hx     Uterine Cancer Neg Hx     Colon Cancer Neg Hx        Social History     Socioeconomic History    Marital status: Unknown     Spouse name: Not on file    Number of children: Not on file    Years of education: Not on file    Highest education level: Not on file   Occupational History    Not on file   Tobacco Use    Smoking status: Never    Smokeless tobacco: Never   Vaping Use    Vaping Use: Never used   Substance and Sexual Activity    Alcohol use: Yes     Comment: socially    Drug use: Never    Sexual activity: Yes     Partners: Male     Birth control/protection: None     Comment: differnt partner   Other Topics Concern    Not on file   Social History Narrative    Abuse: Feels safe at home, no history of physical abuse, no history of sexual abuse      Social Determinants of Psychologist, prison and probation services Strain: Low Risk     Difficulty of Paying Living Expenses: Not hard at all   Food Insecurity: No Food Insecurity    Worried About Programme researcher, broadcasting/film/video in the Last Year: Never true    Ran Out of Food in the Last Year: Never true   Transportation Needs: Unknown    Lack of Transportation (Medical): Not on file    Lack of Transportation (Non-Medical): No   Physical Activity: Not on file   Stress: Not on file   Social Connections: Not on file   Intimate Partner Violence: Not on file   Housing Stability: Unknown    Unable to Pay for  Housing in the Last Year: Not on file    Number of Places Lived in the Last Year: Not on file    Unstable Housing in the Last Year: No           Objective:    Vitals:    05/07/22 0857   BP: 106/62   Site: Right Upper Arm   Position: Sitting   Weight: 178 lb (80.7 kg)   Height: 5' 2.5" (1.588 m)         Constitutional:  well-developed, well-nourished, and in no distress.     Mental: Alert and awake. Oriented to person/place/time. Able to follow commands    Eyes: EOM nl, Sclera nl, Ocular Discharge not visualized    HENT: Normocephalic and atraumatic. Mouth/Throat: Mucous membranes  are moist    External Ears: nl    Neck: Normal range of motion. No masses visualized       Pulmonary: Effort normal. No visualized signs of difficulty breathing or respiratory distress    Pelvic Exam:       External: normal female genitalia without lesions or masses   Nuswab collected    Musculoskeletal: Normal range of motion. Normal gait with no signs of ataxia     Neurological: No Facial Asymmetry (Cranial nerve 7 motor function) No gaze palsy    Skin: No significant exanthematous lesions or discoloration noted on facial skin      Psych: Normal affect. No hallucinations              Assessment/Plan            Patient Active Problem List    Diagnosis Date Noted    BMI 31.0-31.9,adult 10/01/2021     Priority: Medium    Screen for STD (sexually transmitted disease) 05/07/2022     Assessment & Plan Note:     TOC today  GC/CT/TV collected  Declines serum std labs today  Condom use strongly encouraged, we discuss risks of PID/infertility with repeat infections           Problem List Items Addressed This Visit          Other    Screen for STD (sexually transmitted disease) - Primary     TOC today  GC/CT/TV collected  Declines serum std labs today  Condom use strongly encouraged, we discuss risks of PID/infertility with repeat infections           Relevant Orders    Chlamydia, Gonorrhea, Trichomoniasis       Orders Placed This Encounter    Procedures    Chlamydia, Gonorrhea, Trichomoniasis       Outpatient Encounter Medications as of 05/07/2022   Medication Sig Dispense Refill    Norethin Ace-Eth Estrad-FE (FINZALA) 1-20 MG-MCG(24) CHEW Take 1 tablet by mouth daily 3 packet 3    [DISCONTINUED] fluconazole (DIFLUCAN) 150 MG tablet Take 1 tab and repeat in 3 days if needed. 2 tablet 0     No facility-administered encounter medications on file as of 05/07/2022.               Vernell Barrier, APRN - CNP 05/07/22 9:12 AM

## 2022-05-07 NOTE — Progress Notes (Signed)
I have reviewed the patient's visit today including history, exam and assessment by Memory Argue, WHNP-BC.  I agree with treatment/plan as above.    Phylliss Bob, MD  9:15 AM  05/07/22

## 2022-05-07 NOTE — Assessment & Plan Note (Signed)
TOC today  GC/CT/TV collected  Declines serum std labs today  Condom use strongly encouraged, we discuss risks of PID/infertility with repeat infections

## 2022-05-07 NOTE — Progress Notes (Signed)
Patient here for 3 mo TOC for chlamydia.     Patient take medication-yes   S/S today-denies     LAST PAP:  02/03/2022, neg.,     LAST MAMMO:  never     LMP:  Patient's last menstrual period was 04/10/2022 (exact date).    BIRTH CONTROL:  OCP (estrogen/progesterone)    TOBACCO USE:  No    FAMILY HISTORY OF:   Breast Cancer:  No   Ovarian Cancer:  No   Uterine Cancer:  No   Colon Cancer:  No    Vitals:    05/07/22 0857   BP: 106/62   Site: Right Upper Arm   Position: Sitting   Weight: 178 lb (80.7 kg)   Height: 5' 2.5" (1.588 m)        Nicoletta Ba, RN  05/07/22  9:06 AM

## 2022-05-10 LAB — CHLAMYDIA, GONORRHEA, TRICHOMONIASIS
Chlamydia trachomatis, NAA: NEGATIVE
Neisseria Gonorrhoeae, NAA: NEGATIVE
Trichomonas Vaginalis by NAA: NEGATIVE

## 2022-09-11 NOTE — Telephone Encounter (Signed)
Pt lvm asking if she needs blood work before an appointment due to a specific issue she is having. Called pt back, pt states she was traveling and missed OCPs for 2 weeks, but then started taking them again. Pt stated in September she had a period, spotted one day in October, has not started in November but her period tracker says she may start today. Asked pt if she has taken a pregnancy tests. She stated she has taken 4 and they are all negative so she wants to know if she will need to come in for HCG draw. Informed pt that the irregular bleeding could be due to her skipping pills but I will need to consult with Dr. Bonney Leitz. Pt voiced understanding.

## 2022-09-11 NOTE — Telephone Encounter (Signed)
With negative pregnancy tests, this is more likely than not due to missed OCPs.  Her cycle may be irreg for a while until her hormones balance out again

## 2022-09-11 NOTE — Telephone Encounter (Signed)
Called pt, message below reviewed with pt, informed pt if this is ongoing for months then to call and schedule an appointment to be seen. Pt voiced understanding.

## 2022-11-24 NOTE — Telephone Encounter (Signed)
Called patient back, patient stated concern of late menses with current BC she is taking. Patient states that her LMP was 10/11/2022 and she has taken 4 pregnancy tests that have all come back negative, last UPT was 11/24/2022.     Stated to patient that amenorrhea is not an unusual result of taking birth control as long as her UPTs are negative which patient states they have been.     Patient also states she did stop her BC a week ago due to being scared that she may be pregnant, stated to patient that as long her UPT is negative that she can resume BC. Recommended patient repeat UPT in 1-2 weeks and call office with results no matter what they are.     Patient verbalized understanding.

## 2022-12-10 ENCOUNTER — Encounter
Admit: 2022-12-10 | Discharge: 2022-12-10 | Payer: PRIVATE HEALTH INSURANCE | Attending: Women's Health | Primary: Family Medicine

## 2022-12-10 DIAGNOSIS — N926 Irregular menstruation, unspecified: Secondary | ICD-10-CM

## 2022-12-10 LAB — AMB POC URINE PREGNANCY TEST, VISUAL COLOR COMPARISON: HCG, Pregnancy, Urine, POC: NEGATIVE

## 2022-12-10 NOTE — Progress Notes (Signed)
Brianna Williamson is a 27 y.o. G0P0000 who is here to discuss OCPs. Pt currently taking combined OCPs. Pharmacy did change the generic she receives. She did not get a withdraw bleed in in January. She did have symptoms of menses but did not actually have a bleed. Denies any other concerns (HA, nipple discharge, CP, SOB, N/V/D/C, UTI symptoms, or vaginal discharge itching or odor.    Hx chlamydia. Declines std screening today.   Has AE scheduled for April      Patient's last menstrual period was 10/11/2022 (exact date).            ROS:    Breast: Denies pain, lump or nipple discharge    GYN: Denies pelvic pain, discharge, itching, odor or dysuria.     Constitutional: Negative for chills and fever.     Gastrointestinal: Negative for nausea and vomiting. Negative for diarrhea and constipation    Genitourinary: Negative for dysuria and hematuria.           Past Medical History:   Diagnosis Date    Chlamydia        Past Surgical History:   Procedure Laterality Date    WISDOM TOOTH EXTRACTION         Family History   Problem Relation Age of Onset    Hypertension Maternal Grandmother     High Cholesterol Maternal Grandmother     No Known Problems Father     No Known Problems Mother     No Known Problems Brother     Breast Cancer Neg Hx     Ovarian Cancer Neg Hx     Uterine Cancer Neg Hx     Colon Cancer Neg Hx        Social History     Socioeconomic History    Marital status: Unknown     Spouse name: Not on file    Number of children: Not on file    Years of education: Not on file    Highest education level: Not on file   Occupational History    Not on file   Tobacco Use    Smoking status: Never    Smokeless tobacco: Never   Vaping Use    Vaping Use: Never used   Substance and Sexual Activity    Alcohol use: Yes     Comment: socially    Drug use: Never    Sexual activity: Yes     Partners: Male     Birth control/protection: OCP   Other Topics Concern    Not on file   Social History Narrative    Abuse: Feels safe at  home, no history of physical abuse, no history of sexual abuse      Social Determinants of Health     Financial Resource Strain: Low Risk  (05/07/2022)    Overall Financial Resource Strain (CARDIA)     Difficulty of Paying Living Expenses: Not hard at all   Food Insecurity: Not on file (05/07/2022)   Transportation Needs: Unknown (05/07/2022)    PRAPARE - Armed forces logistics/support/administrative officer (Medical): Not on file     Lack of Transportation (Non-Medical): No   Physical Activity: Not on file   Stress: Not on file   Social Connections: Not on file   Intimate Partner Violence: Not on file   Housing Stability: Unknown (05/07/2022)    Housing Stability Vital Sign     Unable to Pay for Housing in the Last Year:  Not on file     Number of Places Lived in the Last Year: Not on file     Unstable Housing in the Last Year: No           Objective:    Vitals:    12/10/22 1349   BP: 120/70   Site: Right Upper Arm   Position: Sitting   Weight: 82.6 kg (182 lb)   Height: 1.588 m (5' 2.5")         Constitutional:  well-developed, well-nourished, and in no distress.     Mental: Alert and awake. Oriented to person/place/time. Able to follow commands    Eyes: EOM nl, Sclera nl, Ocular Discharge not visualized    HENT: Normocephalic and atraumatic. Mouth/Throat: Mucous membranes are moist    External Ears: nl    Neck: Normal range of motion. No masses visualized       Pulmonary: Effort normal. No visualized signs of difficulty breathing or respiratory distress    Musculoskeletal: Normal range of motion. Normal gait with no signs of ataxia     Neurological: No Facial Asymmetry (Cranial nerve 7 motor function) No gaze palsy    Skin: No significant exanthematous lesions or discoloration noted on facial skin      Psych: Normal affect. No hallucinations              Assessment/Plan            Patient Active Problem List    Diagnosis Date Noted    BMI 31.0-31.9,adult 10/01/2021     Priority: Medium    Amenorrhea 12/10/2022     Assessment &  Plan Note:     UPT negative  Likely nl variant with OCPs  We discuss add'l testing with tsh, prl, cbc, cmp. Pt declines as absence of any other symptoms  Continue OCPs, advised to take uPT monthly if no menses and ok to continue if negative  F/u as scheduled for AE in 2 months         Problem List Items Addressed This Visit          Other    Amenorrhea     UPT negative  Likely nl variant with OCPs  We discuss add'l testing with tsh, prl, cbc, cmp. Pt declines as absence of any other symptoms  Continue OCPs, advised to take uPT monthly if no menses and ok to continue if negative  F/u as scheduled for AE in 2 months          Other Visit Diagnoses       Irregular menses    -  Primary    Relevant Orders    AMB POC URINE PREGNANCY TEST, VISUAL COLOR COMPARISON            Orders Placed This Encounter   Procedures    AMB POC URINE PREGNANCY TEST, VISUAL COLOR COMPARISON       Outpatient Encounter Medications as of 12/10/2022   Medication Sig Dispense Refill    Norethin Ace-Eth Estrad-FE (FINZALA) 1-20 MG-MCG(24) CHEW Take 1 tablet by mouth daily 3 packet 3     No facility-administered encounter medications on file as of 12/10/2022.               Marcelino Freestone, APRN - CNP 12/10/22 2:19 PM

## 2022-12-10 NOTE — Assessment & Plan Note (Signed)
UPT negative  Likely nl variant with OCPs  We discuss add'l testing with tsh, prl, cbc, cmp. Pt declines as absence of any other symptoms  Continue OCPs, advised to take uPT monthly if no menses and ok to continue if negative  F/u as scheduled for AE in 2 months

## 2022-12-10 NOTE — Progress Notes (Signed)
Patient comes in today for missed period. Patient states since she started the birth control she noticed the irregular menses. Patient states states she had a period in December but none in January.  Patient states she had some cramping around the time of her period but no bleeding in January. Patient states she has a period October 17th which was late. Patient states the pharmacy switched her pills in the summer.     LAST PAP:  02/03/2022, Negative     LAST MAMMO:  Never    LMP:  Patient's last menstrual period was 10/11/2022 (exact date).    BIRTH CONTROL:  OCP (estrogen/progesterone)    TOBACCO USE:  No    FAMILY HISTORY OF:   Breast Cancer:  No   Ovarian Cancer:  No   Uterine Cancer:  No   Colon Cancer:  No    Vitals:    12/10/22 1349   Weight: 82.6 kg (182 lb)   Height: 1.588 m (5' 2.5")        Valincia Touch L Govani Radloff, MA  12/10/22  2:02 PM

## 2022-12-11 NOTE — Progress Notes (Signed)
I have reviewed the patient's visit today including history, exam and assessment by Brianna Williamson, WHNP-BC.  I agree with treatment/plan as above.    Brianna [place], MD  8:13 AM  12/11/22

## 2023-02-09 ENCOUNTER — Ambulatory Visit
Admit: 2023-02-09 | Discharge: 2023-02-09 | Payer: PRIVATE HEALTH INSURANCE | Attending: Women's Health | Primary: Family Medicine

## 2023-02-09 DIAGNOSIS — Z113 Encounter for screening for infections with a predominantly sexual mode of transmission: Secondary | ICD-10-CM

## 2023-02-09 MED ORDER — NORETHIN ACE-ETH ESTRAD-FE 1-20 MG-MCG(24) PO CHEW
1-20 MG-MCG(24) | PACK | Freq: Every day | ORAL | 4 refills | Status: DC
Start: 2023-02-09 — End: 2024-02-10

## 2023-02-09 NOTE — Progress Notes (Signed)
I have reviewed the patient's visit today including history, exam and assessment by Memory Argue, WHNP-BC.  I agree with treatment/plan as above.    Phylliss Bob, MD  2:03 PM  02/09/23

## 2023-02-09 NOTE — Progress Notes (Signed)
Brianna Williamson is a 27 y.o. G0P0000 who is here for AE        Patient's last menstrual period was 02/02/2023 (exact date).    Menses: sometimes does not get withdraw bleed with OCPs (seen for visit 12/10/22 regarding this) but has gotten withdraw bleed since then    Birth Control:OCPs    Last Pap: 02/03/2022, Negative     Hx abnormal pap or STD: hx chlamydia    Hx of receiving HPV vaccination:received per pt    Last Mammo: never    Fam Hx of Breast, ovarian or uterine cancer:denies    Sexually Active:yes          ROS:    Breast: Denies pain, lump or nipple discharge    GYN: Denies pelvic pain, discharge, itching, odor or dysuria.     Constitutional: Negative for chills and fever.     HENT: Negative for severe headaches or vision changes    Respiratory: Negative for shortness of breath.  Some cough with allergies/asthma    Cardiovascular: Negative for chest pain and palpitations.     Gastrointestinal: Negative for nausea and vomiting. Negative for diarrhea and constipation    Genitourinary: Negative for dysuria and hematuria.     Musculoskeletal: Negative for back pain    Skin: Negative for rash and wound.     Psych: No depression or anxiety          Past Medical History:   Diagnosis Date    Chlamydia        Past Surgical History:   Procedure Laterality Date    WISDOM TOOTH EXTRACTION         Family History   Problem Relation Age of Onset    Hypertension Maternal Grandmother     High Cholesterol Maternal Grandmother     No Known Problems Father     No Known Problems Mother     No Known Problems Brother     Breast Cancer Neg Hx     Ovarian Cancer Neg Hx     Uterine Cancer Neg Hx     Colon Cancer Neg Hx        Social History     Socioeconomic History    Marital status: Unknown     Spouse name: Not on file    Number of children: Not on file    Years of education: Not on file    Highest education level: Not on file   Occupational History    Not on file   Tobacco Use    Smoking status: Never    Smokeless tobacco:  Never   Vaping Use    Vaping Use: Never used   Substance and Sexual Activity    Alcohol use: Yes     Comment: socially    Drug use: Never    Sexual activity: Yes     Partners: Male     Birth control/protection: OCP   Other Topics Concern    Not on file   Social History Narrative    Abuse: Feels safe at home, no history of physical abuse, no history of sexual abuse      Social Determinants of Health     Financial Resource Strain: Low Risk  (05/07/2022)    Overall Financial Resource Strain (CARDIA)     Difficulty of Paying Living Expenses: Not hard at all   Food Insecurity: Not on file (05/07/2022)   Transportation Needs: Unknown (05/07/2022)    PRAPARE - Transportation  Lack of Transportation (Medical): Not on file     Lack of Transportation (Non-Medical): No   Physical Activity: Not on file   Stress: Not on file   Social Connections: Not on file   Intimate Partner Violence: Not on file   Housing Stability: Unknown (05/07/2022)    Housing Stability Vital Sign     Unable to Pay for Housing in the Last Year: Not on file     Number of Places Lived in the Last Year: Not on file     Unstable Housing in the Last Year: No           Objective:    Vitals:    02/09/23 1334   BP: 128/74   Site: Left Upper Arm   Position: Sitting   Weight: 82.6 kg (182 lb)   Height: 1.588 m (5' 2.5")           Constitutional:  well-developed, well-nourished, and in no distress.     Mental: Alert and awake. Oriented to person/place/time. Able to follow commands    Eyes: EOM nl, Sclera nl, Ocular Discharge not visualized    HENT: Normocephalic and atraumatic. Mouth/Throat: Mucous membranes are moist. External Ears: nl. No cervical code adneopathy    Neck: Normal range of motion. Neck supple. No thyromegaly present.     Cardiovascular: Normal rate and regular rhythm.      Pulmonary: Effort normal; No visualized signs of difficulty breathing or respiratory distress; breath sounds normal.    Breast: The breasts exhibit no masses, no skin changes and  no tenderness. No axillary node adenopathy    Abdominal: Soft. There is no tenderness. There is no rebound.     Pelvic Exam:       External: normal female genitalia without lesions or masses       Vagina: normal without lesions or masses, scant clear physiologic discharge noted      Cervix: normal without lesions or masses       Adnexa: normal bimanual exam without masses or fullness       Uterus: uterus is normal size, shape, consistency and nontender    Musculoskeletal: Normal range of motion. Normal gait with no signs of ataxia     Neurological: No Facial Asymmetry (Cranial nerve 7 motor function); No gaze palsy; normal coordination, muscle strength and tone     Skin: Skin is warm and dry. No significant exanthematous lesions or discoloration noted on facial skin      Psych: Normal affect. No hallucinations            Assessment/Plan            Patient Active Problem List    Diagnosis Date Noted    BMI 31.0-31.9,adult 10/01/2021     Priority: Medium    Amenorrhea 12/10/2022    Women's annual routine gynecological examination 02/03/2022     Assessment & Plan Note:      Pap up to date  GC/CT/TV today, declines serum labs  Reports gardasil received as teenager  Refill sent for OCPs  Patient denies h/o DVT, stroke, MI, migraines, liver disease or HTN    Rto 1 year           Problem List Items Addressed This Visit          Other    Women's annual routine gynecological examination      Pap up to date  GC/CT/TV today, declines serum labs  Reports gardasil received as teenager  Refill sent  for OCPs  Patient denies h/o DVT, stroke, MI, migraines, liver disease or HTN    Rto 1 year            Other Visit Diagnoses       Screen for STD (sexually transmitted disease)    -  Primary    Relevant Orders    Chlamydia, Gonorrhea, Trichomoniasis    Encounter for surveillance of contraceptive pills        Relevant Medications    Norethin Ace-Eth Estrad-FE (FINZALA) 1-20 MG-MCG(24) CHEW            Orders Placed This Encounter    Procedures    Chlamydia, Gonorrhea, Trichomoniasis       Outpatient Encounter Medications as of 02/09/2023   Medication Sig Dispense Refill    Norethin Ace-Eth Estrad-FE (FINZALA) 1-20 MG-MCG(24) CHEW Take 1 tablet by mouth daily 3 packet 4    [DISCONTINUED] Norethin Ace-Eth Estrad-FE (FINZALA) 1-20 MG-MCG(24) CHEW Take 1 tablet by mouth daily 3 packet 3     No facility-administered encounter medications on file as of 02/09/2023.

## 2023-02-09 NOTE — Assessment & Plan Note (Signed)
Pap up to date  GC/CT/TV today, declines serum labs  Reports gardasil received as teenager  Refill sent for OCPs  Patient denies h/o DVT, stroke, MI, migraines, liver disease or HTN    Rto 1 year

## 2023-02-09 NOTE — Progress Notes (Signed)
Patient comes in today for annual exam/med refill.     LAST PAP:  02/03/2022, Negative    LAST MAMMO:  Never    LMP:  Patient's last menstrual period was 02/02/2023 (exact date).    BIRTH CONTROL:  OCP (estrogen/progesterone)    TOBACCO USE:  No    FAMILY HISTORY OF:   Breast Cancer:  No   Ovarian Cancer:  No   Uterine Cancer:  No   Colon Cancer:  No    Vitals:    02/09/23 1334   BP: 128/74   Site: Left Upper Arm   Position: Sitting   Weight: 82.6 kg (182 lb)   Height: 1.588 m (5' 2.5")        Theadora Noyes L Victorio Creeden, MA  02/09/23  1:43 PM

## 2023-02-09 NOTE — Progress Notes (Signed)
Chaperone for Intimate Exam     Chaperone was offer accepted as part of the rooming process    Chaperone: Deidre Ivey

## 2023-02-11 LAB — CHLAMYDIA, GONORRHEA, TRICHOMONIASIS
Chlamydia trachomatis, NAA: NEGATIVE
Neisseria Gonorrhoeae, NAA: NEGATIVE
Trichomonas Vaginalis by NAA: NEGATIVE

## 2024-02-10 ENCOUNTER — Ambulatory Visit
Admit: 2024-02-10 | Discharge: 2024-02-10 | Payer: PRIVATE HEALTH INSURANCE | Attending: Women's Health | Primary: Family Medicine

## 2024-02-10 VITALS — BP 114/68 | Ht 62.5 in | Wt 181.0 lb

## 2024-02-10 DIAGNOSIS — Z01419 Encounter for gynecological examination (general) (routine) without abnormal findings: Secondary | ICD-10-CM

## 2024-02-10 NOTE — Assessment & Plan Note (Signed)
 Tearful/moody before menses  We discuss restarting OCP vs SSRI - pt declines  Recommend good sleep, exercise, sunshine, reducing stress during luteal phase  If no improvement, pt to let us  know

## 2024-02-10 NOTE — Addendum Note (Signed)
 Addended by: Raynette Cam on: 02/10/2024 08:45 AM     Modules accepted: Orders

## 2024-02-10 NOTE — Progress Notes (Signed)
 I have reviewed the patient's visit today including history, exam and assessment by Brianna Williamson, WHNP-BC.  I agree with treatment/plan as above.    [name], MD  9:08 AM  02/10/24

## 2024-02-10 NOTE — Assessment & Plan Note (Signed)
 Pap today  Mammo n/a  Gardasil received  Birth control- abstinence  Rto 1 year

## 2024-02-10 NOTE — Progress Notes (Signed)
 Patient here for AE.   Patient states concern of increased emotions the last few months around and during her menses.   Patient wanted to know if this is normal or abnormal and if she should go back on [facility] to help this.     Patient has a good support system, she has noticed increased emotions along with the increase in stress with being in school.     LAST PAP:  02/03/2022, neg., chlamydia pos.     LAST MAMMO:  never     LMP:  Patient's last menstrual period was 02/05/2024 (exact date).    BIRTH CONTROL:  none-last sexually active 06/2023    TOBACCO USE:  No    FAMILY HISTORY OF:   Breast Cancer:  No   Ovarian Cancer:  No   Uterine Cancer:  No   Colon Cancer:  No    Vitals:    02/10/24 0817   BP: 114/68   BP Site: Left Upper Arm   Patient Position: Sitting   Weight: 82.1 kg (181 lb)   Height: 1.588 m (5' 2.5")        Sakari Raisanen, RN  02/10/24  8:27 AM

## 2024-02-10 NOTE — Progress Notes (Signed)
 Brianna Williamson is a 28 y.o. G0P0000 who is here for AE        Patient's last menstrual period was 02/05/2024 (exact date).    Menses:Q month, lasting 5-6 days    Menstrual Complaints: none    Birth Control:abstinence    Date of last Cervical Cancer screen (HPV or PAP): 02/03/2022 negative    Hx abnormal pap or AOZ:[id number]    Hx of receiving HPV vaccination:received    No breast cancer screening on file    Fam Hx of Breast, ovarian or uterine cancer:denies    Sexually Active:no          ROS:    Breast: Denies pain, lump or nipple discharge    GYN: Denies pelvic pain, discharge, itching, odor or dysuria.     Constitutional: Negative for chills and fever.     HENT: Negative for severe headaches or vision changes    Respiratory: Negative for cough and shortness of breath.      Cardiovascular: Negative for chest pain and palpitations.     Gastrointestinal: Negative for nausea and vomiting. Negative for diarrhea and constipation    Genitourinary: Negative for dysuria and hematuria.       Past Medical History:   Diagnosis Date    Chlamydia        Past Surgical History:   Procedure Laterality Date    WISDOM TOOTH EXTRACTION         Family History   Problem Relation Age of Onset    Hypertension Maternal Grandmother     High Cholesterol Maternal Grandmother     No Known Problems Father     No Known Problems Mother     No Known Problems Brother     Breast Cancer Neg Hx     Ovarian Cancer Neg Hx     Uterine Cancer Neg Hx     Colon Cancer Neg Hx        Social History     Socioeconomic History    Marital status: Unknown     Spouse name: Not on file    Number of children: Not on file    Years of education: Not on file    Highest education level: Not on file   Occupational History    Not on file   Tobacco Use    Smoking status: Never    Smokeless tobacco: Never   Vaping Use    Vaping status: Never Used   Substance and Sexual Activity    Alcohol use: Yes     Comment: socially    Drug use: Never    Sexual activity: Not  Currently     Partners: Male     Birth control/protection: None   Other Topics Concern    Not on file   Social History Narrative    Abuse: Feels safe at home, no history of physical abuse, no history of sexual abuse      Social Drivers of Psychologist, prison and probation services Strain: Low Risk  (05/07/2022)    Overall Financial Resource Strain (CARDIA)     Difficulty of Paying Living Expenses: Not hard at all   Food Insecurity: Not on file (05/07/2022)   Transportation Needs: Unknown (05/07/2022)    PRAPARE - Therapist, art (Medical): Not on file     Lack of Transportation (Non-Medical): No   Physical Activity: Not on file   Stress: Not on file   Social Connections: Unknown (  02/25/2022)    Received from [facility]    Social Connections     Frequency of Communication with Friends and Family: Not asked     Frequency of Social Gatherings with Friends and Family: Not asked   Intimate Partner Violence: Unknown (02/25/2022)    Received from [facility]    Intimate Partner Violence     Fear of Current or Ex-Partner: Not asked     Emotionally Abused: Not asked     Physically Abused: Not asked     Sexually Abused: Not asked   Housing Stability: Unknown (05/07/2022)    Housing Stability Vital Sign     Unable to Pay for Housing in the Last Year: Not on file     Number of Places Lived in the Last Year: Not on file     Unstable Housing in the Last Year: No           Objective:    Vitals:    02/10/24 0817   BP: 114/68   BP Site: Left Upper Arm   Patient Position: Sitting   Weight: 82.1 kg (181 lb)   Height: 1.588 m (5' 2.5")           Constitutional:  well-developed, well-nourished, and in no distress.     Mental: Alert and awake. Oriented to person/place/time. Able to follow commands    Eyes: EOM nl, Sclera nl, Ocular Discharge not visualized    HENT: Normocephalic and atraumatic. Mouth/Throat: Mucous membranes are moist. External Ears: nl. No cervical code adneopathy    Neck: Normal range of motion. Neck supple.  No thyromegaly present.     Cardiovascular: Normal rate and regular rhythm.      Pulmonary: Effort normal; No visualized signs of difficulty breathing or respiratory distress; breath sounds normal.    Breast: The breasts exhibit no masses, no skin changes and no tenderness. No axillary node adenopathy    Abdominal: Soft. There is no tenderness. There is no rebound.     Pelvic Exam:       External: normal female genitalia without lesions or masses       Vagina: normal without lesions or masses, scant clear physiologic discharge noted      Cervix: normal without lesions or masses       Adnexa: normal bimanual exam without masses or fullness       Uterus: uterus is normal size, shape, consistency and nontender    Musculoskeletal: Normal range of motion. Normal gait with no signs of ataxia     Neurological: No Facial Asymmetry (Cranial nerve 7 motor function); No gaze palsy; normal coordination, muscle strength and tone     Skin: Skin is warm and dry. No significant exanthematous lesions or discoloration noted on facial skin      Psych: Normal affect. No hallucinations            Assessment/Plan            Patient Active Problem List    Diagnosis Date Noted    BMI 31.0-31.9,adult 10/01/2021     Priority: Medium    Women's annual routine gynecological examination 02/10/2024     Assessment & Plan Note:     Pap today  Mammo n/a  Gardasil received  Birth control- abstinence  Rto 1 year      PMS (premenstrual syndrome) 02/10/2024     Assessment & Plan Note:      Tearful/moody before menses  We discuss restarting OCP vs SSRI - pt  declines  Recommend good sleep, exercise, sunshine, reducing stress during luteal phase  If no improvement, pt to let us know      Amenorrhea 12/10/2022       Problem List Items Addressed This Visit          Other    Women's annual routine gynecological examination - Primary    Pap today  Mammo n/a  Gardasil received  Birth control- abstinence  Rto 1 year         PMS (premenstrual syndrome)      Tearful/moody before menses  We discuss restarting OCP vs SSRI - pt declines  Recommend good sleep, exercise, sunshine, reducing stress during luteal phase  If no improvement, pt to let us know            No orders of the defined types were placed in this encounter.      Outpatient Encounter Medications as of 02/10/2024   Medication Sig Dispense Refill    [DISCONTINUED] Norethin Ace-Eth Estrad-FE (FINZALA) 1-20 MG-MCG(24) CHEW Take 1 tablet by mouth daily 3 packet 4     No facility-administered encounter medications on file as of 02/10/2024.

## 2024-02-10 NOTE — Progress Notes (Signed)
 Chaperone for Intimate Exam     Chaperone was offer accepted as part of the rooming process    Chaperone: Nicoletta Ba

## 2024-02-17 LAB — PAP IG, CT-NG-TV, RFX APTIMA HPV ASCUS (199325)
Chlamydia trachomatis, RNA: NEGATIVE
Date of LMP: 20251104
Neisseria gonorrhoeae, RNA: NEGATIVE
TRICHOMONAS VAGINALIS rRNA: NEGATIVE

## 2024-03-22 MED ORDER — NORETHIN ACE-ETH ESTRAD-FE 1-20 MG-MCG(24) PO CHEW
1-20 | PACK | Freq: Every day | ORAL | 12 refills | Status: DC
Start: 2024-03-22 — End: 2024-06-06

## 2024-03-22 NOTE — Telephone Encounter (Signed)
 My Chart message sent

## 2024-03-22 NOTE — Telephone Encounter (Signed)
 Rx sent.

## 2024-03-22 NOTE — Telephone Encounter (Signed)
 Pt lvm stating she would like to start birth control again. Called pt back, confirmed with pt she was taking Norethin  Ace-Eth Estrad-FE (FINZALA) 1-20 MG-MCG(24) CHEW   Pt confirmed rx. Pt also asked when she needed to start pills. Informed pt I will send her MyChart message once rx gets sent in.      Please advise

## 2024-06-03 ENCOUNTER — Encounter

## 2024-06-06 MED ORDER — NORETHIN ACE-ETH ESTRAD-FE 1-20 MG-MCG(24) PO CHEW
1-20 | PACK | Freq: Every day | ORAL | 3 refills | Status: AC
Start: 2024-06-06 — End: ?

## 2024-06-06 NOTE — Telephone Encounter (Signed)
 Rx of [facility] re-sent for her to receive 3 packs at a time.

## 2024-06-24 ENCOUNTER — Encounter: Payer: BLUE CROSS/BLUE SHIELD | Attending: Physician Assistant | Primary: Physician Assistant

## 2024-06-24 ENCOUNTER — Ambulatory Visit
Admit: 2024-06-24 | Discharge: 2024-06-24 | Payer: BLUE CROSS/BLUE SHIELD | Attending: Physician Assistant | Primary: Physician Assistant

## 2024-06-24 NOTE — Progress Notes (Signed)
 Establish Care  Name: Brianna Williamson Date: 06/24/2024   MRN: [id number] Sex: Female   Age: 28 y.o. Ethnicity: Non-Hispanic / Non Latino   DOB: 06/17/1996 Race: Black / African American      Brianna Williamson is here to establish care.       Assessment & Plan   Encounter to establish care   - vitals and exam without concerning findings. She will provide copy of her lab results from employer in a couple weeks. Fu 1 year or sooner PRN  Cyst of left breast   - improved after bactrim. I do recommend scheduling follow up with [facility] as previously referred due to recurrence of symptoms and she will call to schedule.         Return in about 1 year (around 06/24/2025).       Subjective   Brianna Williamson is here today to establish care. She has no chronic medical conditions other than left breast cyst. It has been there for over a year, had infection last year and treated with bactrim and referred to breast clinic. Went for follow up and cyst size had reduced, was recommended to have it removed but never scheduled. Started getting infected again a couple weeks ago, went to urgent care and treated again with bactrim and has cleared up.     Scheduled to have labs with her employer through [employer] in a couple weeks and will bring copy of results    Review of Systems   Constitutional:  Negative for activity change and unexpected weight change.   Respiratory:  Negative for shortness of breath.    Cardiovascular:  Negative for chest pain.   Gastrointestinal:  Negative for abdominal pain.   Neurological:  Negative for dizziness and headaches.       Allergies   Allergen Reactions    Corn-Containing Products     Soy Hives    Wheat Hives     Prior to Visit Medications   Medication Sig Taking? Authorizing Provider   sulfamethoxazole-trimethoprim (BACTRIM DS;SEPTRA DS) 800-160 MG per tablet Take by mouth 2 times daily Yes [provider]   Norethin  Ace-Eth Estrad-FE 1-20 MG-MCG(24) CHEW Take 1 tablet by mouth  daily Yes Iskandar, Sharyl Charleston, MD     Past Medical History:   Diagnosis Date    Chlamydia      Past Surgical History:   Procedure Laterality Date    WISDOM TOOTH EXTRACTION       Family History   Problem Relation Age of Onset    No Known Problems Mother     No Known Problems Father     No Known Problems Brother     Hypertension Maternal Grandmother     High Cholesterol Maternal Grandmother     Stroke Maternal Grandmother     Cancer Paternal Grandmother     Breast Cancer Neg Hx     Ovarian Cancer Neg Hx     Uterine Cancer Neg Hx     Colon Cancer Neg Hx      Social History     Tobacco Use    Smoking status: Never     Passive exposure: Never    Smokeless tobacco: Never   Vaping Use    Vaping status: Never Used   Substance Use Topics    Alcohol use: Not Currently     Comment: socially    Drug use: Never           Objective  Vital Signs  BP 128/80   Pulse 97   Temp 98.6 F (37 C)   Ht 1.588 m (5' 2.5)   Wt 83.5 kg (184 lb)   LMP 06/01/2024   SpO2 98%   BMI 33.12 kg/m     Wt Readings from Last 3 Encounters:   06/24/24 83.5 kg (184 lb)   02/10/24 82.1 kg (181 lb)   02/09/23 82.6 kg (182 lb)       Physical Exam  Vitals and nursing note reviewed.   Constitutional:       General: She is not in acute distress.     Appearance: Normal appearance.   HENT:      Head: Normocephalic and atraumatic.      Mouth/Throat:      Mouth: Mucous membranes are moist.      Pharynx: Oropharynx is clear. No oropharyngeal exudate.   Eyes:      Conjunctiva/sclera: Conjunctivae normal.      Pupils: Pupils are equal, round, and reactive to light.   Cardiovascular:      Rate and Rhythm: Normal rate and regular rhythm.      Heart sounds: No murmur heard.  Pulmonary:      Effort: Pulmonary effort is normal.      Breath sounds: No wheezing or rales.   Skin:     General: Skin is warm and dry.      Capillary Refill: Capillary refill takes less than 2 seconds.   Neurological:      General: No focal deficit present.      Mental Status: She is  alert and oriented to person, place, and time.   Psychiatric:         Mood and Affect: Mood normal.         Thought Content: Thought content normal.         Judgment: Judgment normal.             Personalized Preventive Plan  Current Health Maintenance Status    There is no immunization history on file for this patient.     Health Maintenance Due   Topic Date Due    HPV vaccine (1 - 3-dose series) Never done    Hepatitis C screen  Never done    Hepatitis B vaccine (1 of 3 - 19+ 3-dose series) Never done    DTaP/Tdap/Td vaccine (1 - Tdap) Never done    COVID-19 Vaccine (1 - 2024-25 season) Never done    Flu vaccine (1) Never done      Recommendations for Preventive Services Due: see orders and patient instructions/AVS.
